# Patient Record
Sex: Male | Born: 2000 | Race: Black or African American | Hispanic: No | Marital: Single | Smoking: Never smoker
Health system: Southern US, Community
[De-identification: ages and names within clinical notes are randomized; demographics above are authoritative.]

## PROBLEM LIST (undated history)

## (undated) DIAGNOSIS — J45909 Unspecified asthma, uncomplicated: Secondary | ICD-10-CM

## (undated) HISTORY — DX: Unspecified asthma, uncomplicated: J45.909

---
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
## 2022-02-17 ENCOUNTER — Ambulatory Visit
Admission: EM | Admit: 2022-02-17 | Discharge: 2022-02-17 | Disposition: A | Discharge status: Home / Self Care | Payer: 59 | Attending: Nurse Practitioner | Admitting: Nurse Practitioner

## 2022-02-17 DIAGNOSIS — J069 Acute upper respiratory infection, unspecified: Secondary | ICD-10-CM | POA: Diagnosis not present

## 2022-02-17 DIAGNOSIS — U071 COVID-19: Secondary | ICD-10-CM | POA: Diagnosis not present

## 2022-02-17 LAB — RESP PANEL BY RT-PCR (FLU A&B, COVID) ARPGX2
Influenza A by PCR: NEGATIVE
Influenza B by PCR: NEGATIVE
SARS Coronavirus 2 by RT PCR: POSITIVE — AB

## 2022-02-17 MED ORDER — BENZONATATE 100 MG PO CAPS: 100.0000 mg | ORAL_CAPSULE | Freq: Three times a day (TID) | ORAL | 0 refills | Status: DC | PRN

## 2022-02-17 NOTE — Discharge Instructions (Addendum)
Your symptoms and exam findings are most consistent with a viral upper respiratory infection. These usually run their course in about 10 days.  If your symptoms last longer than 10 days without improvement, please follow up with your primary care provider.  If your symptoms, worsen, please go to the Emergency Room.    We have tested you today for COVID-19 and influenza.  You will see the results in Mychart and we will call you with positive results.    Please stay home and isolate until you are aware of the results.    Some things that can make you feel better are: - Increased rest - Increasing fluid with water/sugar free electrolytes - Acetaminophen and ibuprofen as needed for fever/pain.  - Salt water gargling, chloraseptic spray and throat lozenges - OTC guaifenesin (Mucinex).  - Saline sinus flushes or a neti pot.  - Humidifying the air. - Cough perles during the day a needed for dry cough.

## 2022-02-17 NOTE — ED Provider Notes (Signed)
[facility]    CSN: [id number] Arrival date & time: 02/17/22  1015      History   Chief Complaint Chief Complaint  Patient presents with   Cough    HPI Michael Francis is a 21 y.o. male.   Patient presents with symptoms that began Saturday evening.  He endorses body aches, chills, dry cough, some nasal congestion and postnasal drainage, runny nose, loss of appetite, and fatigue.  He denies fevers at home, shortness of breath, chest pain, chest congestion, sore throat, headache, tooth or ear pain/pressure, drainage, abdominal pain, nausea/vomiting, diarrhea, loss of taste and smell, and rash.  Reports mom, sister, and stepfather are also sick with similar symptoms.  He has taken Tylenol which does help with his symptoms.  Reports he stayed out of work because of his symptoms.  Medical history significant for asthma.  Reports he has not needed to use his albuterol inhaler.     Past Medical History:  Diagnosis Date   Asthma     There are no problems to display for this patient.   History reviewed. No pertinent surgical history.     Home Medications    Prior to Admission medications   Medication Sig Start Date End Date Taking? Authorizing Provider  benzonatate (TESSALON) 100 MG capsule Take 1 capsule (100 mg total) by mouth 3 (three) times daily as needed for cough. Do not take with alcohol or while driving or operating heavy machinery 02/17/22  Yes Cathlean Marseilles A, NP  albuterol (VENTOLIN HFA) 108 (90 Base) MCG/ACT inhaler 2 puffs every 4-6 hours as needed coughing or wheezing. 12/02/21   Lucio Edward, MD  cetirizine (ZYRTEC) 10 MG tablet 1 tab p.o. nightly as needed allergies. 10/30/21   Lucio Edward, MD    Family History Family History  Problem Relation Age of Onset   Diabetes Maternal Grandfather    Asthma Father    Arthritis Neg Hx    Cancer Neg Hx    Hypertension Neg Hx    Kidney disease Neg Hx    Stroke Neg Hx     Social  History Social History   Tobacco Use   Smoking status: Never   Smokeless tobacco: Never  Vaping Use   Vaping Use: Never used  Substance Use Topics   Alcohol use: No   Drug use: No     Allergies   Patient has no known allergies.   Review of Systems Review of Systems Per HPI  Physical Exam Triage Vital Signs ED Triage Vitals  Enc Vitals Group     BP 02/17/22 1037 131/85     Pulse Rate 02/17/22 1037 95     Resp 02/17/22 1037 18     Temp 02/17/22 1037 99 F (37.2 C)     Temp Source 02/17/22 1037 Oral     SpO2 02/17/22 1037 95 %     Weight 02/17/22 1037 200 lb (90.7 kg)     Height --      Head Circumference --      Peak Flow --      Pain Score 02/17/22 1040 0     Pain Loc --      Pain Edu? --      Excl. in GC? --    No data found.  Updated Vital Signs BP 131/85 (BP Location: Right Arm)   Pulse 95   Temp 99 F (37.2 C) (Oral)   Resp 18   Wt 200 lb (90.7 kg)  SpO2 95%   BMI 28.16 kg/m   Visual Acuity Right Eye Distance:   Left Eye Distance:   Bilateral Distance:    Right Eye Near:   Left Eye Near:    Bilateral Near:     Physical Exam Vitals and nursing note reviewed.  Constitutional:      General: He is not in acute distress.    Appearance: Normal appearance. He is not ill-appearing or toxic-appearing.  HENT:     Head: Normocephalic and atraumatic.     Right Ear: Tympanic membrane, ear canal and external ear normal.     Left Ear: Tympanic membrane, ear canal and external ear normal.     Nose: Congestion and rhinorrhea present.     Mouth/Throat:     Mouth: Mucous membranes are moist.     Pharynx: Oropharynx is clear. Posterior oropharyngeal erythema present. No oropharyngeal exudate.  Eyes:     General: No scleral icterus.    Extraocular Movements: Extraocular movements intact.  Cardiovascular:     Rate and Rhythm: Normal rate and regular rhythm.  Pulmonary:     Effort: Pulmonary effort is normal. No respiratory distress.     Breath sounds:  Normal breath sounds. No wheezing, rhonchi or rales.  Musculoskeletal:     Cervical back: Normal range of motion and neck supple.  Lymphadenopathy:     Cervical: No cervical adenopathy.  Skin:    General: Skin is warm and dry.     Coloration: Skin is not jaundiced or pale.     Findings: No erythema or rash.  Neurological:     Mental Status: He is alert and oriented to person, place, and time.  Psychiatric:        Behavior: Behavior is cooperative.      UC Treatments / Results  Labs (all labs ordered are listed, but only abnormal results are displayed) Labs Reviewed  RESP PANEL BY RT-PCR (FLU A&B, COVID) [id number]    EKG   Radiology No results found.  Procedures Procedures (including critical care time)  Medications Ordered in UC Medications - No data to display  Initial Impression / Assessment and Plan / UC Course  I have reviewed the triage vital signs and the nursing notes.  Pertinent labs & imaging results that were available during my care of the patient were reviewed by me and considered in my medical decision making (see chart for details).    Patient is a very pleasant, well-appearing 21 year old male presenting for fatigue, cough, congestion.  In triage, he is normotensive, has a low-grade fever, not tachycardic, not tachypneic, and oxygenating well on room air.  He appears well and stable.  COVID-19, influenza testing obtained.  Reassured patient that signs and symptoms are consistent with viral upper respiratory infection and discussed supportive care.  Can use Tessalon Perles every 3 hours as needed for cough.  Recommended increasing albuterol inhaler use as needed for wheezing/shortness of breath.  ER precautions discussed.  Seek care if symptoms persist more than 7 to 10 days without improvement despite supportive care.  Note given for work.  The patient was given the opportunity to ask questions.  All questions answered to their satisfaction.  The patient is in  agreement to this plan.   Final Clinical Impressions(s) / UC Diagnoses   Final diagnoses:  Viral URI with cough     Discharge Instructions      Your symptoms and exam findings are most consistent with a viral upper respiratory infection. These usually run  their course in about 10 days.  If your symptoms last longer than 10 days without improvement, please follow up with your primary care provider.  If your symptoms, worsen, please go to the Emergency Room.    We have tested you today for COVID-19 and influenza.  You will see the results in Mychart and we will call you with positive results.    Please stay home and isolate until you are aware of the results.    Some things that can make you feel better are: - Increased rest - Increasing fluid with water/sugar free electrolytes - Acetaminophen and ibuprofen as needed for fever/pain.  - Salt water gargling, chloraseptic spray and throat lozenges - OTC guaifenesin (Mucinex).  - Saline sinus flushes or a neti pot.  - Humidifying the air. - Cough perles during the day a needed for dry cough.      ED Prescriptions     Medication Sig Dispense Auth. Provider   benzonatate (TESSALON) 100 MG capsule Take 1 capsule (100 mg total) by mouth 3 (three) times daily as needed for cough. Do not take with alcohol or while driving or operating heavy machinery 21 capsule Valentino Nose, NP      PDMP not reviewed this encounter.   Valentino Nose, NP 02/17/22 [phone]

## 2022-02-23 DIAGNOSIS — R519 Headache, unspecified: Secondary | ICD-10-CM | POA: Insufficient documentation

## 2022-02-23 DIAGNOSIS — R42 Dizziness and giddiness: Secondary | ICD-10-CM | POA: Diagnosis not present

## 2022-02-24 ENCOUNTER — Emergency Department (HOSPITAL_COMMUNITY): Payer: Medicaid Other

## 2022-02-24 ENCOUNTER — Other Ambulatory Visit: Payer: Self-pay

## 2022-02-24 ENCOUNTER — Emergency Department (HOSPITAL_COMMUNITY)
Admission: EM | Admit: 2022-02-24 | Discharge: 2022-02-24 | Disposition: A | Discharge status: Home / Self Care | Payer: Medicaid Other | Attending: Emergency Medicine | Admitting: Emergency Medicine

## 2022-02-24 ENCOUNTER — Encounter (HOSPITAL_COMMUNITY): Payer: Self-pay

## 2022-02-24 DIAGNOSIS — R42 Dizziness and giddiness: Secondary | ICD-10-CM | POA: Diagnosis not present

## 2022-02-24 DIAGNOSIS — R519 Headache, unspecified: Secondary | ICD-10-CM | POA: Diagnosis not present

## 2022-02-24 NOTE — ED Triage Notes (Signed)
Pov from home. Cc of a headache. Took tylenol around 11pm that is slowly helping. COVID+ as of Tuesday.  1/10 pain.  Wants other symptoms looked at that have been gong on for years while he was here. Never told his pcp. Wasn't going to say anything but he thought about it while he was at the lobby.  States he has occ dizziness and balance problems that.

## 2022-02-24 NOTE — ED Provider Notes (Signed)
[facility] EMERGENCY DEPARTMENT Provider Note   CSN: [id number] Arrival date & time: 02/23/22  2328     History  Chief Complaint  Patient presents with   Headache    Michael Francis is a 21 y.o. male.  Presents to the emergency department with various complaints.  Patient came in tonight because of a headache.  He reports that he gets frequent headaches.  He took Tylenol at home and the headache did not improve so he presented to the ER.  He reports that while he was waiting in the waiting room, however, his headache has eased off.  Patient concerned because he has noticed frequent headaches, intermittent episodes of dizziness, sometimes causing him to have difficulty walking because he is off balance.  He also has a history of memory problems.  These problems have been going on for several years, has not been evaluated by any physician.       Home Medications Prior to Admission medications   Medication Sig Start Date End Date Taking? Authorizing Provider  albuterol (VENTOLIN HFA) 108 (90 Base) MCG/ACT inhaler 2 puffs every 4-6 hours as needed coughing or wheezing. 12/02/21   Lucio Edward, MD  benzonatate (TESSALON) 100 MG capsule Take 1 capsule (100 mg total) by mouth 3 (three) times daily as needed for cough. Do not take with alcohol or while driving or operating heavy machinery 02/17/22   Valentino Nose, NP  cetirizine (ZYRTEC) 10 MG tablet 1 tab p.o. nightly as needed allergies. 10/30/21   Lucio Edward, MD      Allergies    Patient has no known allergies.    Review of Systems   Review of Systems  Physical Exam Updated Vital Signs BP (!) 109/95   Pulse (!) 110   Temp 98.2 F (36.8 C) (Oral)   Resp 20   Ht 5\' 10"  (1.778 m)   Wt 90.7 kg   SpO2 100%   BMI 28.69 kg/m  Physical Exam Vitals and nursing note reviewed.  Constitutional:      General: He is not in acute distress.    Appearance: He is well-developed.  HENT:     Head: Normocephalic and  atraumatic.     Mouth/Throat:     Mouth: Mucous membranes are moist.  Eyes:     General: Vision grossly intact. Gaze aligned appropriately.     Extraocular Movements: Extraocular movements intact.     Conjunctiva/sclera: Conjunctivae normal.  Cardiovascular:     Rate and Rhythm: Normal rate and regular rhythm.     Pulses: Normal pulses.     Heart sounds: Normal heart sounds, S1 normal and S2 normal. No murmur heard.    No friction rub. No gallop.  Pulmonary:     Effort: Pulmonary effort is normal. No respiratory distress.     Breath sounds: Normal breath sounds.  Abdominal:     Palpations: Abdomen is soft.     Tenderness: There is no abdominal tenderness. There is no guarding or rebound.     Hernia: No hernia is present.  Musculoskeletal:        General: No swelling.     Cervical back: Full passive range of motion without pain, normal range of motion and neck supple. No pain with movement, spinous process tenderness or muscular tenderness. Normal range of motion.     Right lower leg: No edema.     Left lower leg: No edema.  Skin:    General: Skin is warm and dry.  Capillary Refill: Capillary refill takes less than 2 seconds.     Findings: No ecchymosis, erythema, lesion or wound.  Neurological:     Mental Status: He is alert and oriented to person, place, and time.     GCS: GCS eye subscore is 4. GCS verbal subscore is 5. GCS motor subscore is 6.     Cranial Nerves: Cranial nerves 2-12 are intact.     Sensory: Sensation is intact.     Motor: Motor function is intact. No weakness or abnormal muscle tone.     Coordination: Coordination is intact.     Comments: Extraocular muscle movement: normal No visual field cut Pupils: equal and reactive both direct and consensual response is normal No nystagmus present    Sensory function is intact to light touch, pinprick Proprioception intact  Grip strength 5/5 symmetric in upper extremities No pronator drift Normal finger to nose  bilaterally  Lower extremity strength 5/5 against gravity Normal heel to shin bilaterally  Gait: normal   Psychiatric:        Mood and Affect: Mood normal.        Speech: Speech normal.        Behavior: Behavior normal.     ED Results / Procedures / Treatments   Labs (all labs ordered are listed, but only abnormal results are displayed) Labs Reviewed - No data to display  EKG None  Radiology CT HEAD WO CONTRAST ( )  Result Date: 02/24/2022 CLINICAL DATA:  Dizziness, persistent/recurrent, cardiac or vascular cause suspected. Headache. EXAM: CT HEAD WITHOUT CONTRAST TECHNIQUE: Contiguous axial images were obtained from the base of the skull through the vertex without intravenous contrast. RADIATION DOSE REDUCTION: This exam was performed according to the departmental dose-optimization program which includes automated exposure control, adjustment of the mA and/or kV according to patient size and/or use of iterative reconstruction technique. COMPARISON:  None Available. FINDINGS: Brain: No acute intracranial hemorrhage, midline shift or mass effect. No extra-axial fluid collection. Gray-white matter differentiation is within normal limits. No hydrocephalus. Vascular: No hyperdense vessel or unexpected calcification. Skull: Normal. Negative for fracture or focal lesion. Sinuses/Orbits: Round density in the ethmoid air cells on the left, possible mucosal retention cyst. No acute orbital abnormality. Other: None. IMPRESSION: No acute intracranial process. Electronically Signed   By: Thornell Sartorius M.D.   On: 02/24/2022 01:28    Procedures Procedures    Medications Ordered in ED Medications - No data to display  ED Course/ Medical Decision Making/ A&P                           Medical Decision Making Amount and/or Complexity of Data Reviewed Radiology: ordered.   Presents for headache tonight.  His headache has resolved after he took Tylenol at home.  He has a normal neurologic  exam.  Patient seems very nervous and concerned about symptoms that have been ongoing for years.  A CT of his head was performed to further evaluate his constellation of neurologic symptoms.  CT is normal as is his exam.  I recommend he follow-up with his primary doctor perhaps for referral to neurology if symptoms continue.        Final Clinical Impression(s) / ED Diagnoses Final diagnoses:  Bad headache    Rx / DC Orders ED Discharge Orders     None         Dashana Guizar, Canary Brim, MD 02/24/22 [phone]

## 2022-03-01 ENCOUNTER — Emergency Department (HOSPITAL_COMMUNITY)
Admission: EM | Admit: 2022-03-01 | Discharge: 2022-03-01 | Disposition: A | Discharge status: Home / Self Care | Payer: 59 | Attending: Emergency Medicine | Admitting: Emergency Medicine

## 2022-03-01 ENCOUNTER — Encounter (HOSPITAL_COMMUNITY): Payer: Self-pay

## 2022-03-01 ENCOUNTER — Other Ambulatory Visit: Payer: Self-pay

## 2022-03-01 DIAGNOSIS — R112 Nausea with vomiting, unspecified: Secondary | ICD-10-CM | POA: Diagnosis not present

## 2022-03-01 DIAGNOSIS — R519 Headache, unspecified: Secondary | ICD-10-CM | POA: Diagnosis not present

## 2022-03-01 DIAGNOSIS — R110 Nausea: Secondary | ICD-10-CM

## 2022-03-01 MED ORDER — ACETAMINOPHEN 325 MG PO TABS: 650.0000 mg | ORAL_TABLET | Freq: Once | ORAL | Status: DC

## 2022-03-01 NOTE — Discharge Instructions (Addendum)
Please drink plenty of fluids and get plenty of rest.  Follow-up with your primary care doctor if needed.  Return to the emergency department for any worsening symptoms.

## 2022-03-01 NOTE — ED Triage Notes (Signed)
Pt c/o headache and nausea starting this morning.  Sts he thinks he ate some bad chips last night.  Pain score 4/10.    Pt reports "I'm really just here for a work note.  I called out and they said I need a note."

## 2022-03-01 NOTE — ED Provider Notes (Signed)
[facility] EMERGENCY DEPARTMENT Provider Note   CSN: [id number] Arrival date & time: 03/01/22  1311     History Chief Complaint  Patient presents with   Headache   Nausea    Michael Francis is a 21 y.o. male patient who presents to the emergency department today for further evaluation of a headache and nausea.  He states that he ate some bad food yesterday.  He denies any vomiting or abdominal pain.  He called out of work today.  Main request today is a work note for calling out of work today.  Denies any other symptoms.    Headache      Home Medications Prior to Admission medications   Medication Sig Start Date End Date Taking? Authorizing Provider  albuterol (VENTOLIN HFA) 108 (90 Base) MCG/ACT inhaler 2 puffs every 4-6 hours as needed coughing or wheezing. 12/02/21   Lucio Edward, MD  benzonatate (TESSALON) 100 MG capsule Take 1 capsule (100 mg total) by mouth 3 (three) times daily as needed for cough. Do not take with alcohol or while driving or operating heavy machinery 02/17/22   Valentino Nose, NP  cetirizine (ZYRTEC) 10 MG tablet 1 tab p.o. nightly as needed allergies. 10/30/21   Lucio Edward, MD      Allergies    Patient has no known allergies.    Review of Systems   Review of Systems  Neurological:  Positive for headaches.  All other systems reviewed and are negative.   Physical Exam Updated Vital Signs Ht 5\' 10"  (1.778 m)   Wt 90.3 kg   BMI 28.55 kg/m  Physical Exam Vitals and nursing note reviewed.  Constitutional:      Appearance: Normal appearance.  HENT:     Head: Normocephalic and atraumatic.  Eyes:     General:        Right eye: No discharge.        Left eye: No discharge.     Conjunctiva/sclera: Conjunctivae normal.  Pulmonary:     Effort: Pulmonary effort is normal.  Skin:    General: Skin is warm and dry.     Findings: No rash.  Neurological:     General: No focal deficit present.     Mental Status: He is alert.   Psychiatric:        Mood and Affect: Mood normal.        Behavior: Behavior normal.     ED Results / Procedures / Treatments   Labs (all labs ordered are listed, but only abnormal results are displayed) Labs Reviewed - No data to display  EKG None  Radiology No results found.  Procedures Procedures    Medications Ordered in ED Medications  acetaminophen (TYLENOL) tablet 650 mg (has no administration in time range)    ED Course/ Medical Decision Making/ A&P                           Medical Decision Making [name] is a 21 y.o. male patient who presents to the emergency department today for evaluation of nausea and to obtain a work note.  Patient is in no acute distress.  Patient does not want any further work-up and just requested a work note.  Work note will be provided and he is safe for discharge   Risk OTC drugs.   Final Clinical Impression(s) / ED Diagnoses Final diagnoses:  Nonintractable headache, unspecified chronicity pattern, unspecified headache type  Nausea  without vomiting    Rx / DC Orders ED Discharge Orders     None         Teressa Lower, [place] 03/01/22 1349    Kneller, Risco K, DO 03/01/22 1534

## 2022-04-15 ENCOUNTER — Emergency Department (HOSPITAL_COMMUNITY)
Admission: EM | Admit: 2022-04-15 | Discharge: 2022-04-15 | Disposition: A | Discharge status: Home / Self Care | Payer: Worker's Compensation | Attending: Emergency Medicine | Admitting: Emergency Medicine

## 2022-04-15 ENCOUNTER — Other Ambulatory Visit: Payer: Self-pay

## 2022-04-15 ENCOUNTER — Encounter (HOSPITAL_COMMUNITY): Payer: Self-pay

## 2022-04-15 DIAGNOSIS — S79921A Unspecified injury of right thigh, initial encounter: Secondary | ICD-10-CM | POA: Diagnosis present

## 2022-04-15 DIAGNOSIS — S71131A Puncture wound without foreign body, right thigh, initial encounter: Secondary | ICD-10-CM | POA: Insufficient documentation

## 2022-04-15 DIAGNOSIS — W460XXA Contact with hypodermic needle, initial encounter: Secondary | ICD-10-CM | POA: Insufficient documentation

## 2022-04-15 DIAGNOSIS — T148XXA Other injury of unspecified body region, initial encounter: Secondary | ICD-10-CM

## 2022-04-15 DIAGNOSIS — Y990 Civilian activity done for income or pay: Secondary | ICD-10-CM | POA: Diagnosis not present

## 2022-04-15 LAB — RAPID HIV SCREEN (HIV 1/2 AB+AG)
HIV 1/2 Antibodies: NONREACTIVE
HIV-1 P24 Antigen - HIV24: NONREACTIVE

## 2022-04-15 NOTE — ED Provider Notes (Signed)
[facility] EMERGENCY DEPARTMENT Provider Note   CSN: [id number] Arrival date & time: 04/15/22  1937     History {Add pertinent medical, surgical, social history, OB history to HPI:1} Chief Complaint  Patient presents with   Possible needle stick    Michael Francis is a 21 y.o. male presenting to ED with concern for possible needlestick.  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  He ignored it.  This occurred 3 days ago.  However he noted a small itchy spot on the outside of his right leg.  He is concerned about a needlestick.  As per his triage note, he reports separately concerns for potential "white spots" in his stool that he noted after a bowel movement recently, but states he does not want this worked up further at this time.  HPI     Home Medications Prior to Admission medications   Medication Sig Start Date End Date Taking? Authorizing Provider  albuterol (VENTOLIN HFA) 108 (90 Base) MCG/ACT inhaler 2 puffs every 4-6 hours as needed coughing or wheezing. 12/02/21   Lucio Edward, MD  benzonatate (TESSALON) 100 MG capsule Take 1 capsule (100 mg total) by mouth 3 (three) times daily as needed for cough. Do not take with alcohol or while driving or operating heavy machinery 02/17/22   Valentino Nose, NP  cetirizine (ZYRTEC) 10 MG tablet 1 tab p.o. nightly as needed allergies. 10/30/21   Lucio Edward, MD      Allergies    Patient has no known allergies.    Review of Systems   Review of Systems  Physical Exam Updated Vital Signs BP 131/86 (BP Location: Right Arm)   Pulse (!) 107   Temp 98.3 F (36.8 C) (Oral)   Resp 18   Ht 5\' 10"  (1.778 m)   Wt 93 kg   SpO2 100%   BMI 29.41 kg/m  Physical Exam Constitutional:      General: He is not in acute distress. HENT:     Head: Normocephalic and atraumatic.  Eyes:     Conjunctiva/sclera: Conjunctivae  normal.     Pupils: Pupils are equal, round, and reactive to light.  Cardiovascular:     Rate and Rhythm: Normal rate and regular rhythm.  Pulmonary:     Effort: Pulmonary effort is normal. No respiratory distress.  Skin:    General: Skin is warm and dry.     Comments: Small punctate lesion on the right anterior thigh without surrounding erythema or underlying induration  Neurological:     General: No focal deficit present.     Mental Status: He is alert. Mental status is at baseline.  Psychiatric:        Mood and Affect: Mood normal.        Behavior: Behavior normal.     ED Results / Procedures / Treatments   Labs (all labs ordered are listed, but only abnormal results are displayed) Labs Reviewed - No data to display  EKG None  Radiology No results found.  Procedures Procedures  {Document cardiac monitor, telemetry assessment procedure when appropriate:1}  Medications Ordered in ED Medications - No data to display  ED Course/ Medical Decision Making/ A&P                           Medical Decision Making Amount and/or Complexity of Data Reviewed  Labs: ordered.   Patient is here concerned for potential needlestick.  It is not clear what may have jabbed him at the time.  We will send HIV and hepatitis testing. I do not see evidence of skin infection or cellulitis.   {Document critical care time when appropriate:1} {Document review of labs and clinical decision tools ie heart score, Chads2Vasc2 etc:1}  {Document your independent review of radiology images, and any outside records:1} {Document your discussion with family members, caretakers, and with consultants:1} {Document social determinants of health affecting pt's care:1} {Document your decision making why or why not admission, treatments were needed:1} Final Clinical Impression(s) / ED Diagnoses Final diagnoses:  None    Rx / DC Orders ED Discharge Orders     None

## 2022-04-15 NOTE — ED Triage Notes (Addendum)
Pt is an employee for [employer] and was working Friday and when pt took out the trash something "pricked" on the side of the right leg. Pt states he does not know what it was and did not think too much of it. Pt now states that site is red and itchy. Pt states he feels fine but is now scared he was stuck by a dirty needle.  Pt also wants to be seen for white substance when he wiped after having a bowl movement.

## 2022-04-15 NOTE — ED Notes (Signed)
Went over dc papers. Verbalized understanding. Ambulatory to lobby.  

## 2022-04-15 NOTE — Discharge Instructions (Signed)
Please follow-up tomorrow morning on the results of your HIV and hepatitis test.

## 2022-04-16 LAB — HEPATITIS PANEL, ACUTE
HCV Ab: NONREACTIVE
Hep A IgM: NONREACTIVE
Hep B C IgM: NONREACTIVE
Hepatitis B Surface Ag: NONREACTIVE

## 2022-06-16 ENCOUNTER — Encounter (HOSPITAL_COMMUNITY): Payer: Self-pay

## 2022-06-16 ENCOUNTER — Emergency Department (HOSPITAL_COMMUNITY)
Admission: EM | Admit: 2022-06-16 | Discharge: 2022-06-16 | Disposition: A | Discharge status: Home / Self Care | Payer: Medicaid Other | Attending: Emergency Medicine | Admitting: Emergency Medicine

## 2022-06-16 ENCOUNTER — Other Ambulatory Visit: Payer: Self-pay

## 2022-06-16 ENCOUNTER — Emergency Department (HOSPITAL_COMMUNITY): Payer: Medicaid Other

## 2022-06-16 DIAGNOSIS — R918 Other nonspecific abnormal finding of lung field: Secondary | ICD-10-CM | POA: Diagnosis not present

## 2022-06-16 DIAGNOSIS — Z8616 Personal history of COVID-19: Secondary | ICD-10-CM | POA: Diagnosis not present

## 2022-06-16 DIAGNOSIS — R051 Acute cough: Secondary | ICD-10-CM

## 2022-06-16 DIAGNOSIS — U071 COVID-19: Secondary | ICD-10-CM | POA: Insufficient documentation

## 2022-06-16 DIAGNOSIS — J45909 Unspecified asthma, uncomplicated: Secondary | ICD-10-CM | POA: Insufficient documentation

## 2022-06-16 DIAGNOSIS — R059 Cough, unspecified: Secondary | ICD-10-CM | POA: Diagnosis not present

## 2022-06-16 LAB — RESP PANEL BY RT-PCR (RSV, FLU A&B, COVID)  RVPGX2
Influenza A by PCR: NEGATIVE
Influenza B by PCR: NEGATIVE
Resp Syncytial Virus by PCR: NEGATIVE
SARS Coronavirus 2 by RT PCR: POSITIVE — AB

## 2022-06-16 MED ORDER — AMOXICILLIN 500 MG PO CAPS: 500.0000 mg | ORAL_CAPSULE | Freq: Three times a day (TID) | ORAL | 0 refills | Status: DC

## 2022-06-16 MED ORDER — NAPROXEN 250 MG PO TABS
500.0000 mg | ORAL_TABLET | Freq: Once | ORAL | Status: AC
  Administered 2022-06-16: 500 mg via ORAL
  Filled 2022-06-16: qty 2

## 2022-06-16 MED ORDER — ALBUTEROL SULFATE HFA 108 (90 BASE) MCG/ACT IN AERS: 2.0000 | INHALATION_SPRAY | RESPIRATORY_TRACT | 1 refills | Status: AC | PRN

## 2022-06-16 MED ORDER — NAPROXEN 500 MG PO TABS: 500.0000 mg | ORAL_TABLET | Freq: Two times a day (BID) | ORAL | 0 refills | Status: DC

## 2022-06-16 MED ORDER — AZITHROMYCIN 250 MG PO TABS: ORAL_TABLET | ORAL | 0 refills | Status: DC

## 2022-06-16 MED ORDER — ALBUTEROL SULFATE HFA 108 (90 BASE) MCG/ACT IN AERS
2.0000 | INHALATION_SPRAY | Freq: Once | RESPIRATORY_TRACT | Status: AC
  Administered 2022-06-16: 2 via RESPIRATORY_TRACT
  Filled 2022-06-16: qty 6.7

## 2022-06-16 NOTE — ED Triage Notes (Signed)
Pt reports cough and congestion x 2 days.

## 2022-06-16 NOTE — ED Provider Notes (Signed)
Patient initially seen by Dr. Pilar Plate.  Please see his note.  Plan was to follow-up on the chest x-ray.  Follow-up chest x-ray result however does not show any signs of pneumonia.  Will discontinue the antibiotic regimen.  Patient is positive for COVID which would account for his symptoms.  No indication for antibiotics  DG Chest 2 View  Result Date: 06/16/2022 CLINICAL DATA:  Cough and congestion since Thursday EXAM: CHEST - 2 VIEW COMPARISON:  Earlier today FINDINGS: Normal heart size and mediastinal contours. There is no edema, consolidation, effusion, or pneumothorax. The sharp interface along the right paramediastinal contour is likely confluence of the manubrial cortex and anterior junction line, appearance similar to a 2017 radiograph. IMPRESSION: No active cardiopulmonary disease. No pulmonary collapse or other abnormality on the lateral view. Electronically Signed   By: Tiburcio Pea M.D.   On: 06/16/2022 07:03   DG Chest Port 1 View  Result Date: 06/16/2022 CLINICAL DATA:  Coughing congestion. EXAM: PORTABLE CHEST 1 VIEW COMPARISON:  07/18/2015 FINDINGS: The lungs are clear without focal pneumonia, edema, pneumothorax or pleural effusion. Abnormal right paramediastinal contour raises concern for a component of right upper lobe collapse. The cardiopericardial silhouette is within normal limits for size. The visualized bony structures of the thorax are unremarkable. IMPRESSION: Abnormal right paramediastinal opacity concerning for underlying element of right upper lobe collapse. Dedicated PA and lateral upright chest x-ray could be used to further evaluate. CT chest could also be employed. Electronically Signed   By: Kennith Center M.D.   On: 06/16/2022 06:09      Linwood Dibbles, MD 06/16/22 [phone]

## 2022-06-16 NOTE — ED Provider Notes (Signed)
AP-EMERGENCY DEPT [facility] Emergency Department Provider Note MRN:  [id number]  Arrival date & time: 06/16/22     Chief Complaint   Cough (congestion)   History of Present Illness   Michael Francis is a 21 y.o. year-old male with a history of asthma presenting to the ED with chief complaint of cough.  Cough and congestion since Thursday, subjective fever, malaise, fatigue.  Feels like he has chest congestion and cannot cough it out.  Also has tooth pain that he thinks is due to a wisdom tooth.  Review of Systems  A thorough review of systems was obtained and all systems are negative except as noted in the HPI and PMH.   Patient's Health History    Past Medical History:  Diagnosis Date   Asthma     History reviewed. No pertinent surgical history.  Family History  Problem Relation Age of Onset   Diabetes Maternal Grandfather    Asthma Father    Arthritis Neg Hx    Cancer Neg Hx    Hypertension Neg Hx    Kidney disease Neg Hx    Stroke Neg Hx     Social History   Socioeconomic History   Marital status: Single    Spouse name: Not on file   Number of children: Not on file   Years of education: Not on file   Highest education level: Not on file  Occupational History   Not on file  Tobacco Use   Smoking status: Never   Smokeless tobacco: Never  Vaping Use   Vaping Use: Never used  Substance and Sexual Activity   Alcohol use: No   Drug use: No   Sexual activity: Never  Other Topics Concern   Not on file  Social History Narrative   Lives with mother, stepfather and 2 half siblings.   Graduated from high school.   Works at [employer].   Social Determinants of Health   Financial Resource Strain: Not on file  Food Insecurity: Not on file  Transportation Needs: Not on file  Physical Activity: Not on file  Stress: Not on file  Social Connections: Not on file  Intimate Partner Violence: Not on file     Physical Exam   Vitals:   06/16/22  0535  Temp: 99.9 F (37.7 C)    CONSTITUTIONAL: Well-appearing, NAD NEURO/PSYCH:  Alert and oriented x 3, no focal deficits EYES:  eyes equal and reactive ENT/NECK:  no LAD, no JVD CARDIO: Regular rate, well-perfused, normal S1 and S2 PULM: Rhonchi to the right upper lung GI/GU:  non-distended, non-tender MSK/SPINE:  No gross deformities, no edema SKIN:  no rash, atraumatic   *Additional and/or pertinent findings included in MDM below  Diagnostic and Interventional Summary    EKG Interpretation  Date/Time:    Ventricular Rate:    PR Interval:    QRS Duration:   QT Interval:    QTC Calculation:   R Axis:     Text Interpretation:         Labs Reviewed  RESP PANEL BY RT-PCR (RSV, FLU A&B, COVID)  [id number]    DG Chest Port 1 View  Final Result    DG Chest 2 View    (Results Pending)    Medications  naproxen (NAPROSYN) tablet 500 mg (500 mg Oral Given 06/16/22 0619)  albuterol (VENTOLIN HFA) 108 (90 Base) MCG/ACT inhaler 2 puff (2 puffs Inhalation Given 06/16/22 1761)     Procedures  /  Critical Care Procedures  ED Course and Medical Decision Making  Initial Impression and Ddx Favoring viral illness, bit of an asymmetric chest auscultation and so obtaining chest x-ray to evaluate for pneumonia.  Patient does have an impacted wisdom tooth, no signs of infection orally.  Past medical/surgical history that increases complexity of ED encounter: Asthma  Interpretation of Diagnostics I personally reviewed the Chest Xray and my interpretation is as follows: Question right upper lobe collapse, question pneumonia    Patient Reassessment and Ultimate Disposition/Management     Repeating x-rays to evaluate the question right upper lobe collapse, anticipating discharge with CAP coverage.  Patient management required discussion with the following services or consulting groups:  None  Complexity of Problems Addressed Acute complicated illness or Injury  Additional  Data Reviewed and Analyzed Further history obtained from: None  Additional Factors Impacting ED Encounter Risk Prescriptions  Elmer Sow. Pilar Plate, MD [facility] Health Emergency Medicine [facility] [email or web address]  Final Clinical Impressions(s) / ED Diagnoses     ICD-10-CM   1. Acute cough  R05.1       ED Discharge Orders          Ordered    azithromycin (ZITHROMAX) 250 MG tablet        06/16/22 0659    amoxicillin (AMOXIL) 500 MG capsule  3 times daily        06/16/22 0659    naproxen (NAPROSYN) 500 MG tablet  2 times daily        06/16/22 0659    albuterol (VENTOLIN HFA) 108 (90 Base) MCG/ACT inhaler  Every 4 hours PRN        06/16/22 0659             Discharge Instructions Discussed with and Provided to Patient:    Discharge Instructions      You were evaluated in the Emergency Department and after careful evaluation, we did not find any emergent condition requiring admission or further testing in the hospital.  Your exam/testing today is overall reassuring.  Symptoms may be due to pneumonia.  Take the amoxicillin and azithromycin antibiotics as directed.  Use Naprosyn as needed for discomfort.  Recommend follow-up with your primary care doctor and a dentist.  Please return to the Emergency Department if you experience any worsening of your condition.   Thank you for allowing [place] to be a part of your care.      Sabas Sous, MD 06/16/22 [phone]

## 2022-06-16 NOTE — Discharge Instructions (Addendum)
You were evaluated in the Emergency Department and after careful evaluation, we did not find any emergent condition requiring admission or further testing in the hospital.  Your exam/testing today is overall reassuring.  The CXR did not show pneumonia.  Your coivd test is positive which would explain your symptoms.  This should resolve on its own.  Use Naprosyn as needed for discomfort.  You can take over the counter cough medications as needed. Recommend follow-up with your primary care doctor and a dentist.  Please return to the Emergency Department if you experience any worsening of your condition.   Thank you for allowing [place] to be a part of your care.

## 2022-06-18 ENCOUNTER — Telehealth: Payer: Self-pay | Admitting: *Deleted

## 2022-06-18 DIAGNOSIS — U071 COVID-19: Secondary | ICD-10-CM | POA: Diagnosis not present

## 2022-06-18 DIAGNOSIS — J45909 Unspecified asthma, uncomplicated: Secondary | ICD-10-CM | POA: Diagnosis not present

## 2022-06-18 DIAGNOSIS — J301 Allergic rhinitis due to pollen: Secondary | ICD-10-CM | POA: Diagnosis not present

## 2022-06-18 NOTE — Patient Outreach (Signed)
  Care Coordination [facility] Note Transition Care Management Follow-up Telephone Call Date of discharge and from where: 06/16/22 from [facility] ED How have you been since you were released from the hospital? "I am doing ok." Any questions or concerns? Yes   Items Reviewed: Did the pt receive and understand the discharge instructions provided? Yes  Medications obtained and verified? Yes  Other? Yes  Any new allergies since your discharge? No  Dietary orders reviewed? No Do you have support at home? Yes   Home Care and Equipment/Supplies: Were home health services ordered? no If so, what is the name of the agency? N/A  Has the agency set up a time to come to the patient's home? not applicable Were any new equipment or medical supplies ordered?  No What is the name of the medical supply agency? N/A Were you able to get the supplies/equipment? not applicable Do you have any questions related to the use of the equipment or supplies? No  Functional Questionnaire: (I = Independent and D = Dependent) ADLs: I  Bathing/Dressing- I  Meal Prep- I  Eating- I  Maintaining continence- I  Transferring/Ambulation- I  Managing Meds- I  Follow up appointments reviewed:  PCP Hospital f/u appt confirmed? Yes  Patient had visit with PCP on 06/18/22. Specialist Hospital f/u appt confirmed?  N/A-ED visit Are transportation arrangements needed? No  If their condition worsens, is the pt aware to call PCP or go to the Emergency Dept.? Yes Was the patient provided with contact information for the PCP's office or ED? Yes Was to pt encouraged to call back with questions or concerns? Yes  SDOH assessments and interventions completed:   Yes

## 2022-07-15 ENCOUNTER — Ambulatory Visit (HOSPITAL_COMMUNITY)
Admission: RE | Admit: 2022-07-15 | Discharge: 2022-07-15 | Disposition: A | Discharge status: Home / Self Care | Payer: Commercial Managed Care - PPO | Source: Ambulatory Visit | Attending: Family Medicine | Admitting: Family Medicine

## 2022-07-15 ENCOUNTER — Other Ambulatory Visit (HOSPITAL_COMMUNITY): Payer: Self-pay | Admitting: Family Medicine

## 2022-07-15 DIAGNOSIS — J45909 Unspecified asthma, uncomplicated: Secondary | ICD-10-CM | POA: Diagnosis not present

## 2022-07-15 DIAGNOSIS — R079 Chest pain, unspecified: Secondary | ICD-10-CM | POA: Insufficient documentation

## 2022-07-15 DIAGNOSIS — R0789 Other chest pain: Secondary | ICD-10-CM | POA: Diagnosis not present

## 2022-08-08 DIAGNOSIS — J301 Allergic rhinitis due to pollen: Secondary | ICD-10-CM | POA: Diagnosis not present

## 2022-08-08 DIAGNOSIS — R030 Elevated blood-pressure reading, without diagnosis of hypertension: Secondary | ICD-10-CM | POA: Diagnosis not present

## 2022-08-08 DIAGNOSIS — K921 Melena: Secondary | ICD-10-CM | POA: Diagnosis not present

## 2022-08-08 DIAGNOSIS — J45909 Unspecified asthma, uncomplicated: Secondary | ICD-10-CM | POA: Diagnosis not present

## 2022-08-13 DIAGNOSIS — R142 Eructation: Secondary | ICD-10-CM | POA: Diagnosis not present

## 2022-08-13 DIAGNOSIS — K921 Melena: Secondary | ICD-10-CM | POA: Diagnosis not present

## 2022-08-13 DIAGNOSIS — R143 Flatulence: Secondary | ICD-10-CM | POA: Diagnosis not present

## 2022-08-15 DIAGNOSIS — K921 Melena: Secondary | ICD-10-CM | POA: Diagnosis not present

## 2022-08-29 DIAGNOSIS — H9221 Otorrhagia, right ear: Secondary | ICD-10-CM | POA: Diagnosis not present

## 2022-11-11 DIAGNOSIS — L989 Disorder of the skin and subcutaneous tissue, unspecified: Secondary | ICD-10-CM | POA: Diagnosis not present

## 2022-11-11 DIAGNOSIS — R2231 Localized swelling, mass and lump, right upper limb: Secondary | ICD-10-CM | POA: Diagnosis not present

## 2022-11-11 DIAGNOSIS — R519 Headache, unspecified: Secondary | ICD-10-CM | POA: Diagnosis not present

## 2022-11-11 DIAGNOSIS — R454 Irritability and anger: Secondary | ICD-10-CM | POA: Diagnosis not present

## 2023-01-26 ENCOUNTER — Other Ambulatory Visit: Payer: Self-pay

## 2023-01-26 ENCOUNTER — Encounter (HOSPITAL_COMMUNITY): Payer: Self-pay | Admitting: Emergency Medicine

## 2023-01-26 ENCOUNTER — Emergency Department (HOSPITAL_COMMUNITY)
Admission: EM | Admit: 2023-01-26 | Discharge: 2023-01-27 | Disposition: A | Discharge status: Other Acute Inpatient Hospital | Payer: Commercial Managed Care - PPO | Attending: Emergency Medicine | Admitting: Emergency Medicine

## 2023-01-26 DIAGNOSIS — Z008 Encounter for other general examination: Secondary | ICD-10-CM | POA: Insufficient documentation

## 2023-01-26 DIAGNOSIS — F329 Major depressive disorder, single episode, unspecified: Secondary | ICD-10-CM | POA: Insufficient documentation

## 2023-01-26 DIAGNOSIS — Z1152 Encounter for screening for COVID-19: Secondary | ICD-10-CM | POA: Diagnosis not present

## 2023-01-26 DIAGNOSIS — R000 Tachycardia, unspecified: Secondary | ICD-10-CM | POA: Diagnosis not present

## 2023-01-26 DIAGNOSIS — J45909 Unspecified asthma, uncomplicated: Secondary | ICD-10-CM | POA: Diagnosis not present

## 2023-01-26 LAB — CBC WITH DIFFERENTIAL/PLATELET
Abs Immature Granulocytes: 0.02 10*3/uL (ref 0.00–0.07)
Basophils Absolute: 0 10*3/uL (ref 0.0–0.1)
Basophils Relative: 1 %
Eosinophils Absolute: 0.1 10*3/uL (ref 0.0–0.5)
Eosinophils Relative: 1 %
HCT: 45.5 % (ref 39.0–52.0)
Hemoglobin: 14.9 g/dL (ref 13.0–17.0)
Immature Granulocytes: 0 %
Lymphocytes Relative: 27 %
Lymphs Abs: 2.4 10*3/uL (ref 0.7–4.0)
MCH: 26.2 pg (ref 26.0–34.0)
MCHC: 32.7 g/dL (ref 30.0–36.0)
MCV: 80 fL (ref 80.0–100.0)
Monocytes Absolute: 0.8 10*3/uL (ref 0.1–1.0)
Monocytes Relative: 9 %
Neutro Abs: 5.5 10*3/uL (ref 1.7–7.7)
Neutrophils Relative %: 62 %
Platelets: 319 10*3/uL (ref 150–400)
RBC: 5.69 MIL/uL (ref 4.22–5.81)
RDW: 15 % (ref 11.5–15.5)
WBC: 8.9 10*3/uL (ref 4.0–10.5)
nRBC: 0 % (ref 0.0–0.2)

## 2023-01-26 LAB — COMPREHENSIVE METABOLIC PANEL
ALT: 51 U/L — ABNORMAL HIGH (ref 0–44)
AST: 27 U/L (ref 15–41)
Albumin: 4.4 g/dL (ref 3.5–5.0)
Alkaline Phosphatase: 87 U/L (ref 38–126)
Anion gap: 11 (ref 5–15)
BUN: 11 mg/dL (ref 6–20)
CO2: 25 mmol/L (ref 22–32)
Calcium: 9.3 mg/dL (ref 8.9–10.3)
Chloride: 104 mmol/L (ref 98–111)
Creatinine, Ser: 0.86 mg/dL (ref 0.61–1.24)
GFR, Estimated: >60 mL/min (ref >60)
Glucose, Bld: 89 mg/dL (ref 70–99)
Potassium: 3.9 mmol/L (ref 3.5–5.1)
Sodium: 140 mmol/L (ref 135–145)
Total Bilirubin: 0.3 mg/dL (ref 0.3–1.2)
Total Protein: 7.8 g/dL (ref 6.5–8.1)

## 2023-01-26 LAB — ETHANOL: Alcohol, Ethyl (B): <10 mg/dL (ref <10)

## 2023-01-26 NOTE — ED Triage Notes (Signed)
Pt BIB [employer] under IVC, per paperwork pt is paranoid and has been violent/aggressive, pt is calm and cooperative in triage and PD reports he has been same with them, pt states he and his Mother were arguing earlier, his Mother left, after she came back PD showed up and arrested him and brought him here, pt reports he has thrown things when he's been angry, reports he does keep blinds closed but neighbors are strange and nosey, denies SI/HI

## 2023-01-26 NOTE — ED Provider Notes (Signed)
AP-EMERGENCY DEPT [facility] Emergency Department Provider Note MRN:  [id number]  Arrival date & time: 01/27/23     Chief Complaint   IVC   History of Present Illness   Adeeb Beto Fougerousse is a 22 y.o. year-old male with a history of asthma presenting to the ED with chief complaint of IVC.  Reportedly patient is acting paranoid at home, IVC by family.  Was in an argument with family and broke some property, concern for violence.  Review of Systems  A thorough review of systems was obtained and all systems are negative except as noted in the HPI and PMH.   Patient's Health History    Past Medical History:  Diagnosis Date   Asthma     History reviewed. No pertinent surgical history.  Family History  Problem Relation Age of Onset   Diabetes Maternal Grandfather    Asthma Father    Arthritis Neg Hx    Cancer Neg Hx    Hypertension Neg Hx    Kidney disease Neg Hx    Stroke Neg Hx     Social History   Socioeconomic History   Marital status: Single    Spouse name: Not on file   Number of children: Not on file   Years of education: Not on file   Highest education level: Not on file  Occupational History   Not on file  Tobacco Use   Smoking status: Never   Smokeless tobacco: Never  Vaping Use   Vaping status: Never Used  Substance and Sexual Activity   Alcohol use: No   Drug use: No   Sexual activity: Never  Other Topics Concern   Not on file  Social History Narrative   Lives with mother, stepfather and 2 half siblings.   Graduated from high school.   Works at [employer].   Social Determinants of Health   Financial Resource Strain: Not on file  Food Insecurity: Not on file  Transportation Needs: Not on file  Physical Activity: Not on file  Stress: Not on file  Social Connections: Not on file  Intimate Partner Violence: Not on file     Physical Exam   Vitals:   01/26/23 2149 01/26/23 2152  BP: (!) 143/115 (!) 142/89  Pulse: 92 82  Resp:  16 18  Temp: 98.7 F (37.1 C) 98.7 F (37.1 C)  SpO2: 100% 99%    CONSTITUTIONAL: Well-appearing, NAD NEURO/PSYCH:  Alert and oriented x 3, no focal deficits EYES:  eyes equal and reactive ENT/NECK:  no LAD, no JVD CARDIO: Regular rate, well-perfused, normal S1 and S2 PULM:  CTAB no wheezing or rhonchi GI/GU:  non-distended, non-tender MSK/SPINE:  No gross deformities, no edema SKIN:  no rash, atraumatic   *Additional and/or pertinent findings included in MDM below  Diagnostic and Interventional Summary    EKG Interpretation Date/Time:  Monday January 26 2023 23:40:53 EDT Ventricular Rate:  111 PR Interval:  142 QRS Duration:  84 QT Interval:  328 QTC Calculation: 446 R Axis:   96  Text Interpretation: Sinus tachycardia Rightward axis Borderline ECG No previous ECGs available Confirmed by Kennis Carina [phone]) on 01/27/2023 12:15:12 AM       Labs Reviewed  COMPREHENSIVE METABOLIC PANEL - Abnormal; Notable for the following components:      Result Value   ALT 51 (*)    All other components within normal limits  ETHANOL  CBC WITH DIFFERENTIAL/PLATELET  RAPID URINE DRUG SCREEN, HOSP PERFORMED    No orders  to display    Medications - No data to display   Procedures  /  Critical Care Procedures  ED Course and Medical Decision Making  Initial Impression and Ddx IVC for possible new delusion/paranoia and violent behavior.  Patient very calm and cooperative here in the emergency department.  Past medical/surgical history that increases complexity of ED encounter: None  Interpretation of Diagnostics I personally reviewed the EKG and my interpretation is as follows: Sinus rhythm  Labs reassuring with no significant blood count or electrolyte disturbance  Patient Reassessment and Ultimate Disposition/Management     Patient is medically cleared awaiting TTS evaluation.  Signed out to default provider.  Patient management required discussion with the following  services or consulting groups:  None  Complexity of Problems Addressed Acute illness or injury that poses threat of life of bodily function  Additional Data Reviewed and Analyzed Further history obtained from: Police on arrival  Additional Factors Impacting ED Encounter Risk Consideration of hospitalization  Elmer Sow. Pilar Plate, MD [facility] Emergency Medicine [facility] [email or web address]  Final Clinical Impressions(s) / ED Diagnoses     ICD-10-CM   1. Encounter for psychological evaluation  Z00.8       ED Discharge Orders     None        Discharge Instructions Discussed with and Provided to Patient:   Discharge Instructions   None      Sabas Sous, MD 01/27/23 [phone]

## 2023-01-27 DIAGNOSIS — J45909 Unspecified asthma, uncomplicated: Secondary | ICD-10-CM | POA: Diagnosis not present

## 2023-01-27 DIAGNOSIS — R040 Epistaxis: Secondary | ICD-10-CM | POA: Diagnosis not present

## 2023-01-27 DIAGNOSIS — F258 Other schizoaffective disorders: Secondary | ICD-10-CM | POA: Diagnosis not present

## 2023-01-27 DIAGNOSIS — F329 Major depressive disorder, single episode, unspecified: Secondary | ICD-10-CM | POA: Diagnosis not present

## 2023-01-27 DIAGNOSIS — R519 Headache, unspecified: Secondary | ICD-10-CM | POA: Diagnosis not present

## 2023-01-27 DIAGNOSIS — F419 Anxiety disorder, unspecified: Secondary | ICD-10-CM | POA: Diagnosis not present

## 2023-01-27 LAB — SARS CORONAVIRUS 2 BY RT PCR: SARS Coronavirus 2 by RT PCR: NEGATIVE

## 2023-01-27 MED ORDER — ACETAMINOPHEN 500 MG PO TABS
1000.0000 mg | ORAL_TABLET | Freq: Once | ORAL | Status: AC
  Administered 2023-01-27: 1000 mg via ORAL
  Filled 2023-01-27: qty 2

## 2023-01-27 NOTE — BH Assessment (Signed)
Comprehensive Clinical Assessment (CCA) Note  01/27/2023 Michael Francis [id number]  Disposition: Rockney Ghee, NP, patient meets inpatient criteria. Jeanella Anton, RN informed of disposition. Disposition SW will secure placement.   The patient demonstrates the following risk factors for suicide: Chronic risk factors for suicide include: psychiatric disorder of unknown per patient . Acute risk factors for suicide include: family or marital conflict and loss (financial, interpersonal, professional). Protective factors for this patient include: responsibility to others (children, family), coping skills, and hope for the future. Considering these factors, the overall suicide risk at this point appears to be moderate. Patient is not appropriate for outpatient follow up.  Michael Francis is a 22 year old male presenting [facility] under [facility]C due to violence, aggressiveness and paranoid behaviors. Patient denied SI, HI, psychosis and alcohol/drug usage. Patient reported history of diagnosis, however unable to recall the name of the diagnosis.   Patient reported getting into an argument with parents and that he lost control where he felt "I was out of reality". Patient reports "I got upset and poured drink everywhere, throwing things, the door". Patient reports when he gets angry he throws things. Patient reported arguing about him getting another car after his stepfather said he would buy one for him and then saying that he needed his own place and patient stated he was not financially able to do both, patient then became upset and became aggressive with his parents and destroying property. Patient reported not knowing why he acted in an aggressive way. Patient reported that his mother attempted several times to have him evaluated as a child, however he doesn't recall diagnosis or outcome. Patient reported stressors including needing a car, getting his own place and a broken laptop. Patient denied being  depressed. Patient reported normal sleep and appetite.   When asked about hallucinations, patient stated "I have thoughts of hating certain groups of people, not to hurt them but to hate them".   Patient denied receiving any outpatient mental health services. Patient denied being prescribed any psych medications. Patient denied prior psych inpatient treatment. Patient denied past suicide attempts and self-harming behaviors.   Patient currently resides with mother and stepdad. Patient reports family discord. 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 Patient denied access to guns. Patient was anxious and cooperative during assessment.   Collateral contact: Lavena Bullion, mother, [phone], consent received from patient to speak with mother for additional information. Attempted to contact mother, no answer.   Chief Complaint:  Chief Complaint  Patient presents with   IVC   Visit Diagnosis: Major depressive disorder   CCA Screening, Triage and Referral (STR)  Patient Reported Information How did you hear about [place]? Family/Friend  What Is the Reason for Your Visit/Call Today? IVC due to aggressive behaviors.  How Long Has This Been Causing You Problems? <Week  What Do You Feel Would Help You the Most Today? Treatment for Depression or other mood problem   Have You Recently Had Any Thoughts About Hurting Yourself? No  Are You Planning to Commit Suicide/Harm Yourself At This time? No   Flowsheet Row ED from 01/26/2023 in [facility] Department at [facility] ED from [facility]/16/2022 in [facility] Department at [facility] ED from [facility]/15/2022 in [facility] Emergency Department at [facility]  C-SSRS RISK CATEGORY No Risk No Risk No Risk       Have you Recently Had Thoughts About Hurting Someone Karolee Ohs? No  Are You Planning to  Harm Someone at This Time? No  Explanation: n/a   Have You Used Any Alcohol or Drugs in the Past 24  Hours? No  What Did You Use and How Much? n/a   Do You Currently Have a Therapist/Psychiatrist? No  Name of Therapist/Psychiatrist: Name of Therapist/Psychiatrist: n/a   Have You Been Recently Discharged From Any Office Practice or Programs? No  Explanation of Discharge From Practice/Program: n/a     CCA Screening Triage Referral Assessment Type of Contact: Tele-Assessment  Telemedicine Service Delivery: Telemedicine service delivery: This service was provided via telemedicine using a 2-way, interactive audio and video technology  Is this Initial or Reassessment? Is this Initial or Reassessment?: Initial Assessment  Date Telepsych consult ordered in [facility]:  Date Telepsych consult ordered in [facility]: 01/26/23  Time Telepsych consult ordered in [facility]:  Time Telepsych consult ordered in [facility]: 2343  Location of Assessment: [facility]  Provider Location: [facility] Assessment Services   Collateral Involvement: IVC   Does Patient Have a Automotive engineer Guardian? No  Legal Guardian Contact Information: n/a  Copy of Legal Guardianship Form: -- (n/a)  Legal Guardian Notified of Arrival: -- (n/a)  Legal Guardian Notified of Pending Discharge: -- (n/a)  If Minor and Not Living with Parent(s), Who has Custody? n/a  Is CPS involved or ever been involved? Never  Is APS involved or ever been involved? Never   Patient Determined To Be At Risk for Harm To Self or Others Based on Review of Patient Reported Information or Presenting Complaint? No  Method: No Plan  Availability of Means: No access or NA  Intent: Vague intent or NA  Notification Required: No need or identified person  Additional Information for Danger to Others Potential: -- (n/a)  Additional Comments for Danger to Others Potential: n/a  Are There Guns or Other Weapons in Your Home? No  Types of Guns/Weapons: n/a  Are These Weapons Safely Secured?                            -- (n/a)  Who Could Verify You Are  Able To Have These Secured: n/a  Do You Have any Outstanding Charges, Pending Court Dates, Parole/Probation? none reported  Contacted To Inform of Risk of Harm To Self or Others: Patent examiner; Family/Significant Other:    Does Patient Present under Involuntary Commitment? Yes    [place] of Residence: [place]   Patient Currently Receiving the Following Services: Not Receiving Services   Determination of Need: Urgent (48 hours)   Options For Referral: Medication Management; Outpatient Therapy; Inpatient Hospitalization     CCA Biopsychosocial Patient Reported Schizophrenia/Schizoaffective Diagnosis in Past: No   Strengths: self-awareness   Mental Health Symptoms Depression:   None   Duration of Depressive symptoms:  Duration of Depressive Symptoms: N/A   Mania:   Change in energy/activity; Irritability; Racing thoughts; Increased Energy   Anxiety:    Irritability; Restlessness; Difficulty concentrating   Psychosis:   Delusions   Duration of Psychotic symptoms:  Duration of Psychotic Symptoms: Less than six months   Trauma:   None   Obsessions:   None   Compulsions:   None   Inattention:   None   Hyperactivity/Impulsivity:   None   Oppositional/Defiant Behaviors:   None   Emotional Irregularity:   None   Other Mood/Personality Symptoms:   n/a    Mental Status Exam Appearance and self-care  Stature:   Average   Weight:  Average weight   Clothing:   Age-appropriate   Grooming:   Normal   Cosmetic use:   None   Posture/gait:   Normal   Motor activity:   Not Remarkable   Sensorium  Attention:   Normal   Concentration:   Normal   Orientation:   Time; Situation; Place; Person; Object   Recall/memory:   Normal   Affect and Mood  Affect:   Appropriate   Mood:   Anxious   Relating  Eye contact:   Normal   Facial expression:   Anxious   Attitude toward examiner:   Cooperative   Thought and  Language  Speech flow:  Clear and Coherent   Thought content:   Appropriate to Mood and Circumstances   Preoccupation:   None   Hallucinations:   None   Organization:   Coherent   [employer] of Knowledge:   Average   Intelligence:   Average   Abstraction:   Normal   Judgement:   Normal   Reality Testing:   Adequate   Insight:   Fair   Decision Making:   Impulsive   Social Functioning  Social Maturity:   Impulsive   Social Judgement:   Normal   Stress  Stressors:   Relationship   Coping Ability:   Overwhelmed; Exhausted   Skill Deficits:   Museum/gallery curator; Decision making   Supports:   Family     Religion: Religion/Spirituality Are You A Religious Person?: Yes How Might This Affect Treatment?: n/a  Leisure/Recreation: Leisure / Recreation Do You Have Hobbies?: Yes Leisure and Hobbies: video games  Exercise/Diet: Exercise/Diet Do You Exercise?: No Have You Gained or Lost A Significant Amount of Weight in the Past Six Months?: No Do You Follow a Special Diet?: No Do You Have Any Trouble Sleeping?: No   CCA Employment/Education Employment/Work Situation: Employment / Work Situation Employment Situation: Employed Work Stressors: yes Patient's Job has Been Impacted by Current Illness: No Describe how Patient's Job has Been Impacted: n/a Has Patient ever Been in the [employer]?: No  Education: Education Is Patient Currently Attending School?: No Last Grade Completed: 12 Did You Product manager?: No Did You Have An Individualized Education Program (IIEP): No Did You Have Any Difficulty At [employer]?: No Patient's Education Has Been Impacted by Current Illness: No   CCA Family/Childhood History Family and Relationship History: Family history Marital status: Single Does patient have children?: No  Childhood History:  Childhood History By whom was/is the patient raised?: Mother, Mother/father and  step-parent Did patient suffer any verbal/emotional/physical/sexual abuse as a child?: No Did patient suffer from severe childhood neglect?: No Has patient ever been sexually abused/assaulted/raped as an adolescent or adult?: No Was the patient ever a victim of a crime or a disaster?: No Witnessed domestic violence?: No Has patient been affected by domestic violence as an adult?: No       CCA Substance Use Alcohol/Drug Use: Alcohol / Drug Use Pain Medications: SEE MAR Prescriptions: SEE MAR Over the Counter: SEE MAR History of alcohol / drug use?: No history of alcohol / drug abuse Longest period of sobriety (when/how long): n/a Negative Consequences of Use:  (n/a) Withdrawal Symptoms:  (n/a)                         ASAM's:  Six Dimensions of Multidimensional Assessment  Dimension 1:  Acute Intoxication and/or Withdrawal Potential:   Dimension 1:  Description of individual's past  and current experiences of substance use and withdrawal: n/a  Dimension 2:  Biomedical Conditions and Complications:   Dimension 2:  Description of patient's biomedical conditions and  complications: n/a  Dimension 3:  Emotional, Behavioral, or Cognitive Conditions and Complications:     Dimension 4:  Readiness to Change:  Dimension 4:  Description of Readiness to Change criteria: n/a  Dimension 5:  Relapse, Continued use, or Continued Problem Potential:  Dimension 5:  Relapse, continued use, or continued problem potential critiera description: n/a  Dimension 6:  Recovery/Living Environment:  Dimension 6:  Recovery/Iiving environment criteria description: n/a  ASAM Severity Score:    ASAM Recommended Level of Treatment:     Substance use Disorder (SUD) Substance Use Disorder (SUD)  Checklist Symptoms of Substance Use:  (n/a)  Recommendations for Services/Supports/Treatments: Recommendations for Services/Supports/Treatments Recommendations For Services/Supports/Treatments: Individual  Therapy, IOP (Intensive Outpatient Program), Medication Management, Partial Hospitalization, Inpatient Hospitalization  Discharge Disposition: Discharge Disposition Medical Exam completed: Yes Disposition of Patient: Admit  DSM5 Diagnoses: There are no problems to display for this patient.    Referrals to Alternative Service(s): Referred to Alternative Service(s):   Place:   Date:   Time:    Referred to Alternative Service(s):   Place:   Date:   Time:    Referred to Alternative Service(s):   Place:   Date:   Time:    Referred to Alternative Service(s):   Place:   Date:   Time:     Burnetta Sabin, [facility]

## 2023-01-27 NOTE — ED Notes (Signed)
Security wanded pt ?

## 2023-01-27 NOTE — Progress Notes (Signed)
LCSW Progress Note  [id number]   Raymond Kallay  01/27/2023  9:54 AM  Description:   Inpatient Psychiatric Referral  Patient was recommended inpatient per Rockney Ghee, NP. There are no available beds at [facility], per [facility] Rona Ravens, RN. Patient was referred to the following out of network facilities:   [facility] Provider Address Phone Fax  [facility]  [address], [place] [address] [phone]  [facility]  [address] [place] [address] [phone]  [facility]  [address] [place] [address] [phone]  [facility]  [address] [place], [place] [address] [phone]  [facility] [address] [place]  [address] [place] [address] [phone]  [facility]  [address] [place], [place] [address] [phone]  [facility]  [address] [place] [address] [phone]  [facility]  [address] [place] [address] [phone]  [facility]  [address] [place] [address] [phone]  [facility]  [address] [place], [place] [address] [phone]  [facility]  [address] [place] [address] [phone]  [facility]  [address] [place] [address] [phone]  [facility]  [address] [place] [address] [phone]  [facility]  [address] [place] [address] [phone]  [facility]  [address] [place] [address] [phone]  [facility]  [address],  [place] [address] [phone]  [facility]  [address], [place] [address] [phone]  [facility]  [address] [place] [address] [phone]  [facility]  [address] [place] [address] [phone]  [facility]  [address], [place] [address] [phone]  [facility]  [address] [place], [place] [address] [phone]  [facility]  [address] [place] [address] [phone]  [facility]  [address] [place] [address] [phone]  [facility]  [address] [place] [address] [phone]  [facility]  [address], [place] [address] [phone]  [facility]  [address] [place] [address] [phone]  [facility]  [address], [place] [address] [phone]  [facility] Patient Placement  [facility], [place] [phone]  [facility]  [facility] [address] [place] [address] [phone]  [facility] [place]  [address] [place], [place] [address] [phone]  Renella Cunas  [place] -- [phone]  [facility]  [address] [place] [address] [phone]    Situation ongoing, CSW to continue following and update chart as more information becomes available.      Cathie Beams, LCSW  01/27/2023 9:54 AM

## 2023-01-27 NOTE — ED Notes (Signed)
Nurse called facility to inform them that pat has left.

## 2023-01-27 NOTE — ED Notes (Addendum)
Pt requested all contacts be removed from his chart because he doesn't want anyone having his information.  Stated " I don't want them called even if I am dead. They shouldn't know anything since they put me in here." Pt now stated he does not want TTS to call his mother as collateral. Nurse informed pt TTS needs someone and he said " well , I don't have anyone".

## 2023-01-27 NOTE — ED Notes (Signed)
PD here to escort pt . Belongings with cell phone on the bag.

## 2023-01-27 NOTE — ED Notes (Signed)
Pt has not eaten today. 

## 2023-01-27 NOTE — Progress Notes (Signed)
[facility], pending IVC paperwork faxed to [phone].  Pt meets inpatient criteria per Rockney Ghee, NP  Attending Physician will be Neila Gear, MD  Report can be called to: [phone]  Pt can arrive after pending items are received  Care Team Notified: Gretta Cool, MD, Caryn Bee, NP, and Iona Coach, RN  [place], LCSW  01/27/2023 10:34 AM

## 2023-01-27 NOTE — Progress Notes (Signed)
Pt is currently under review at [facility], pending IVC paperwork faxed to [phone], attn: Onalee Hua. CSW notified RN.  Cathie Beams, LCSW  01/27/2023 10:27 AM

## 2023-01-27 NOTE — ED Notes (Signed)
Pt dressed out into scrubs. Pt belongings placed into 1 bag into locker. Pt is frustrated due to he is supposed to go to work at [employer] and he is missing work.

## 2023-02-06 ENCOUNTER — Other Ambulatory Visit: Payer: Self-pay

## 2023-02-06 ENCOUNTER — Encounter (HOSPITAL_COMMUNITY): Payer: Self-pay | Admitting: Emergency Medicine

## 2023-02-06 ENCOUNTER — Emergency Department (HOSPITAL_COMMUNITY)
Admission: EM | Admit: 2023-02-06 | Discharge: 2023-02-06 | Disposition: A | Discharge status: Home / Self Care | Payer: Commercial Managed Care - PPO | Source: Home / Self Care | Attending: Emergency Medicine | Admitting: Emergency Medicine

## 2023-02-06 DIAGNOSIS — F411 Generalized anxiety disorder: Secondary | ICD-10-CM | POA: Diagnosis not present

## 2023-02-06 DIAGNOSIS — R251 Tremor, unspecified: Secondary | ICD-10-CM | POA: Insufficient documentation

## 2023-02-06 DIAGNOSIS — T50905A Adverse effect of unspecified drugs, medicaments and biological substances, initial encounter: Secondary | ICD-10-CM | POA: Diagnosis not present

## 2023-02-06 DIAGNOSIS — T43595A Adverse effect of other antipsychotics and neuroleptics, initial encounter: Secondary | ICD-10-CM | POA: Insufficient documentation

## 2023-02-06 DIAGNOSIS — R531 Weakness: Secondary | ICD-10-CM | POA: Diagnosis not present

## 2023-02-06 DIAGNOSIS — J45909 Unspecified asthma, uncomplicated: Secondary | ICD-10-CM | POA: Insufficient documentation

## 2023-02-06 NOTE — Discharge Instructions (Signed)
Are seen emergency department today with concern for medication reaction.  The medication that you recently started does have a known side effect of movement disorders such as restlessness, difficulty swallowing, and tremors.  I suspect this is likely what was causing your symptoms.  You can take some Benadryl to help with this.  Please follow-up with the provider who prescribed it to you.

## 2023-02-06 NOTE — ED Provider Notes (Signed)
[place] EMERGENCY DEPARTMENT AT [facility] Provider Note   CSN: [id number] Arrival date & time: 02/06/23  2042     History  Chief Complaint  Patient presents with   Medication Reaction    Michael Francis is a 22 y.o. male with history of asthma who presents the emergency department with concern for a medication reaction.  Patient was recently started on Abilify and states that he has been having tremors, and feeling an intermittent "tight throat".  He says this only happens every now and then, but when it does he feels very anxious and has a hard time breathing.  No difficulty swallowing.  No sore throat or cough.  He is not having any symptoms at this moment.  HPI     Home Medications Prior to Admission medications   Medication Sig Start Date End Date Taking? Authorizing Provider  albuterol (VENTOLIN HFA) 108 (90 Base) MCG/ACT inhaler Inhale 2 puffs into the lungs every 4 (four) hours as needed for wheezing or shortness of breath. 06/16/22   Sabas Sous, MD      Allergies    Patient has no known allergies.    Review of Systems   Review of Systems  HENT:         "Tight throat"  All other systems reviewed and are negative.   Physical Exam Updated Vital Signs BP 134/76 (BP Location: Right Arm)   Pulse (!) 108   Temp 98.6 F (37 C) (Oral)   Resp 15   Ht 5\' 10"  (1.778 m)   Wt 93 kg   SpO2 100%   BMI 29.42 kg/m  Physical Exam Vitals and nursing note reviewed.  Constitutional:      Appearance: Normal appearance.  HENT:     Head: Normocephalic and atraumatic.     Mouth/Throat:     Lips: Pink.     Mouth: Mucous membranes are moist.     Pharynx: Oropharynx is clear. Uvula midline.  Eyes:     Conjunctiva/sclera: Conjunctivae normal.  Neck:     Thyroid: No thyroid mass or thyromegaly.     Trachea: Trachea and phonation normal.  Cardiovascular:     Rate and Rhythm: Normal rate and regular rhythm.  Pulmonary:     Effort: Pulmonary  effort is normal. No respiratory distress.     Breath sounds: Normal breath sounds.  Musculoskeletal:     Cervical back: Full passive range of motion without pain.  Lymphadenopathy:     Cervical: No cervical adenopathy.  Skin:    General: Skin is warm and dry.  Neurological:     Mental Status: He is alert.  Psychiatric:        Mood and Affect: Mood normal.        Behavior: Behavior normal.     ED Results / Procedures / Treatments   Labs (all labs ordered are listed, but only abnormal results are displayed) Labs Reviewed - No data to display  EKG None  Radiology No results found.  Procedures Procedures    Medications Ordered in ED Medications - No data to display  ED Course/ Medical Decision Making/ A&P                                 Medical Decision Making  Patient is a 22 year old male with history of asthma who presents the emergency department with concern for medication reaction. Patient is describing a "tight  throat" sensation after he was started on Abilify.   On exam patient is clinically well-appearing, normal vital signs, no acute distress.Marland Kitchen  HEENT exam normal.  Lung sounds clear.  Discussed with him I suspect this is likely an extraparametal symptom which is an known adverse effect of Abilify.  He has already stopped the medication and is made the prescribing provider aware.  Recommended he take Benadryl if needed, and follow-up with his PCP.  Final Clinical Impression(s) / ED Diagnoses Final diagnoses:  Adverse effect of drug, initial encounter    Rx / DC Orders ED Discharge Orders     None      Portions of this report may have been transcribed using voice recognition software. Every effort was made to ensure accuracy; however, inadvertent computerized transcription errors may be present.    Jeanella Flattery 02/06/23 2138    Bethann Berkshire, MD 02/07/23 1212

## 2023-02-06 NOTE — ED Triage Notes (Signed)
Pt with c/o "tight throat". States he is "unsure if he has a clogged vein or what". Denies difficulty swallowing. Pt recently started on new medication (Aripiprazole) and states it has been giving him tremors.

## 2023-02-11 ENCOUNTER — Ambulatory Visit: Payer: Commercial Managed Care - PPO | Admitting: Psychiatry

## 2023-02-11 ENCOUNTER — Encounter: Payer: Self-pay | Admitting: Psychiatry

## 2023-02-11 NOTE — Progress Notes (Deleted)
Referring:  [name], NP [address] [place],  [place] [address]  PCP: John Giovanni, MD  Neurology was asked to evaluate Michael Francis, a 22 year old male for a chief complaint of headaches.  Our recommendations of care will be communicated by shared medical record.    CC:  headaches  History provided from ***  HPI:  Medical co-morbidities: asthma, schizoaffective disorder   The patient presents for evaluation of headaches which began one year ago. Headaches are associated with nausea***Takes Tylenol or goody powder which help***  Headache History: Onset: Triggers: Aura: Location: Quality/Description: Associated Symptoms:  Photophobia:  Phonophobia:  Nausea: Vomiting: Allodynia: Other symptoms: Worse with activity?: Duration of headaches:  Headache days per month: *** Migraine days per month: *** Headache free days per month: ***  Current Treatment: Abortive ***  Preventative ***  Prior Therapies                                 ***   LABS: CBC    Component Value Date/Time   WBC 8.9 01/26/2023 2318   RBC 5.69 01/26/2023 2318   HGB 14.9 01/26/2023 2318   HCT 45.5 01/26/2023 2318   PLT 319 01/26/2023 2318   MCV 80.0 01/26/2023 2318   MCH 26.2 01/26/2023 2318   MCHC 32.7 01/26/2023 2318   RDW 15.0 01/26/2023 2318   LYMPHSABS 2.4 01/26/2023 2318   MONOABS 0.8 01/26/2023 2318   EOSABS 0.1 01/26/2023 2318   BASOSABS 0.0 01/26/2023 2318      Latest Ref Rng & Units 01/26/2023   11:18 PM 09/11/2020   11:38 AM  CMP  Glucose 70 - 99 mg/dL 89  99   BUN 6 - 20 mg/dL 11  9   Creatinine 1.91 - 1.24 mg/dL 4.78  2.95   Sodium 621 - 145 mmol/L 140  140   Potassium 3.5 - 5.1 mmol/L 3.9  4.5   Chloride 98 - 111 mmol/L 104  103   CO2 22 - 32 mmol/L 25  28   Calcium 8.9 - 10.3 mg/dL 9.3  30.8   Total Protein 6.5 - 8.1 g/dL 7.8  7.9   Total Bilirubin 0.3 - 1.2 mg/dL 0.3  0.4   Alkaline Phos 38 - 126 U/L 87    AST 15 - 41 U/L 27  14    ALT 0 - 44 U/L 51  18      IMAGING:  [facility] 02/24/22: unremarkable  Imaging independently reviewed on February 11, 2023   Current Outpatient Medications on File Prior to Visit  Medication Sig Dispense Refill   albuterol (VENTOLIN HFA) 108 (90 Base) MCG/ACT inhaler Inhale 2 puffs into the lungs every 4 (four) hours as needed for wheezing or shortness of breath. 18 g 1   No current facility-administered medications on file prior to visit.     Allergies: No Known Allergies  Family History: Migraine or other headaches in the family:  *** Aneurysms in a first degree relative:  *** Brain tumors in the family:  *** Other neurological illness in the family:   ***  Past Medical History: Past Medical History:  Diagnosis Date   Asthma     Past Surgical History No past surgical history on file.  Social History: Social History   Tobacco Use   Smoking status: Never   Smokeless tobacco: Never  Vaping Use   Vaping status: Never Used  Substance Use Topics  Alcohol use: No   Drug use: No   ***  ROS: Negative for fevers, chills. Positive for***. All other systems reviewed and negative unless stated otherwise in HPI.   Physical Exam:   Vital Signs: There were no vitals taken for this visit. GENERAL: well appearing,in no acute distress,alert SKIN:  Color, texture, turgor normal. No rashes or lesions HEAD:  Normocephalic/atraumatic. CV:  RRR RESP: Normal respiratory effort MSK: no tenderness to palpation over occiput, neck, or shoulders  NEUROLOGICAL: Mental Status: Alert, oriented to person, place and time,Follows commands Cranial Nerves: PERRL, visual fields intact to confrontation, extraocular movements intact, facial sensation intact, no facial droop or ptosis, hearing grossly intact, no dysarthria, palate elevate symmetrically, tongue protrudes midline, shoulder shrug intact and symmetric Motor: muscle strength 5/5 both upper and lower extremities,no drift, normal  tone Reflexes: 2+ throughout Sensation: intact to light touch all 4 extremities Coordination: Finger-to- nose-finger intact bilaterally Gait: normal-based   IMPRESSION: ***  PLAN: ***   I spent a total of *** minutes chart reviewing and counseling the patient. Headache education was done. Discussed treatment options including preventive and acute medications, natural supplements, and physical therapy. Discussed medication overuse headache and to limit use of acute treatments to no more than 2 days/week or 10 days/month. Discussed medication side effects, adverse reactions and drug interactions. Written educational materials and patient instructions outlining all of the above were given.  Follow-up: ***   Ocie Doyne, MD 02/11/2023   10:14 AM

## 2023-02-17 DIAGNOSIS — R519 Headache, unspecified: Secondary | ICD-10-CM | POA: Diagnosis not present

## 2023-02-17 DIAGNOSIS — M2550 Pain in unspecified joint: Secondary | ICD-10-CM | POA: Diagnosis not present

## 2023-02-17 DIAGNOSIS — Z1329 Encounter for screening for other suspected endocrine disorder: Secondary | ICD-10-CM | POA: Diagnosis not present

## 2023-02-25 DIAGNOSIS — K219 Gastro-esophageal reflux disease without esophagitis: Secondary | ICD-10-CM | POA: Diagnosis not present

## 2023-02-25 DIAGNOSIS — G44209 Tension-type headache, unspecified, not intractable: Secondary | ICD-10-CM | POA: Diagnosis not present

## 2023-02-25 DIAGNOSIS — M2550 Pain in unspecified joint: Secondary | ICD-10-CM | POA: Diagnosis not present

## 2023-02-25 DIAGNOSIS — E559 Vitamin D deficiency, unspecified: Secondary | ICD-10-CM | POA: Diagnosis not present

## 2023-03-02 DIAGNOSIS — Z23 Encounter for immunization: Secondary | ICD-10-CM | POA: Diagnosis not present

## 2023-03-05 ENCOUNTER — Encounter: Payer: Self-pay | Admitting: *Deleted

## 2023-03-09 ENCOUNTER — Other Ambulatory Visit (HOSPITAL_COMMUNITY): Payer: Self-pay

## 2023-03-09 DIAGNOSIS — F411 Generalized anxiety disorder: Secondary | ICD-10-CM | POA: Diagnosis not present

## 2023-03-09 MED ORDER — LAMOTRIGINE 25 MG PO TABS
25.0000 mg | ORAL_TABLET | Freq: Every day | ORAL | 0 refills | Status: AC
  Filled 2023-03-09: qty 15, 15d supply, fill #0

## 2023-03-09 MED ORDER — HYDROXYZINE HCL 25 MG PO TABS
25.0000 mg | ORAL_TABLET | Freq: Two times a day (BID) | ORAL | 0 refills | Status: AC | PRN
  Filled 2023-03-09: qty 30, 15d supply, fill #0

## 2023-03-12 DIAGNOSIS — F419 Anxiety disorder, unspecified: Secondary | ICD-10-CM | POA: Diagnosis not present

## 2023-03-12 DIAGNOSIS — J45909 Unspecified asthma, uncomplicated: Secondary | ICD-10-CM | POA: Diagnosis not present

## 2023-03-12 DIAGNOSIS — E559 Vitamin D deficiency, unspecified: Secondary | ICD-10-CM | POA: Diagnosis not present

## 2023-03-12 DIAGNOSIS — Z1152 Encounter for screening for COVID-19: Secondary | ICD-10-CM | POA: Diagnosis not present

## 2023-03-12 DIAGNOSIS — F32A Depression, unspecified: Secondary | ICD-10-CM | POA: Diagnosis not present

## 2023-03-12 DIAGNOSIS — Z5901 Sheltered homelessness: Secondary | ICD-10-CM | POA: Diagnosis not present

## 2023-03-12 DIAGNOSIS — R45851 Suicidal ideations: Secondary | ICD-10-CM | POA: Diagnosis not present

## 2023-03-12 DIAGNOSIS — R569 Unspecified convulsions: Secondary | ICD-10-CM | POA: Diagnosis not present

## 2023-03-12 DIAGNOSIS — F603 Borderline personality disorder: Secondary | ICD-10-CM | POA: Diagnosis not present

## 2023-03-12 DIAGNOSIS — K219 Gastro-esophageal reflux disease without esophagitis: Secondary | ICD-10-CM | POA: Diagnosis not present

## 2023-03-13 DIAGNOSIS — F603 Borderline personality disorder: Secondary | ICD-10-CM | POA: Diagnosis not present

## 2023-03-14 DIAGNOSIS — F603 Borderline personality disorder: Secondary | ICD-10-CM | POA: Diagnosis not present

## 2023-03-14 DIAGNOSIS — J45909 Unspecified asthma, uncomplicated: Secondary | ICD-10-CM | POA: Diagnosis not present

## 2023-03-14 DIAGNOSIS — E559 Vitamin D deficiency, unspecified: Secondary | ICD-10-CM | POA: Diagnosis not present

## 2023-03-14 DIAGNOSIS — K219 Gastro-esophageal reflux disease without esophagitis: Secondary | ICD-10-CM | POA: Diagnosis not present

## 2023-03-20 ENCOUNTER — Other Ambulatory Visit (HOSPITAL_COMMUNITY): Payer: Self-pay

## 2023-03-30 DIAGNOSIS — F411 Generalized anxiety disorder: Secondary | ICD-10-CM | POA: Diagnosis not present

## 2023-06-12 DIAGNOSIS — Z23 Encounter for immunization: Secondary | ICD-10-CM | POA: Diagnosis not present

## 2024-03-11 ENCOUNTER — Encounter: Payer: Self-pay | Admitting: *Deleted

## 2024-03-20 DIAGNOSIS — H6692 Otitis media, unspecified, left ear: Secondary | ICD-10-CM | POA: Diagnosis not present

## 2024-03-20 DIAGNOSIS — J069 Acute upper respiratory infection, unspecified: Secondary | ICD-10-CM | POA: Diagnosis not present

## 2024-03-20 DIAGNOSIS — Z20822 Contact with and (suspected) exposure to covid-19: Secondary | ICD-10-CM | POA: Diagnosis not present

## 2024-03-20 DIAGNOSIS — K219 Gastro-esophageal reflux disease without esophagitis: Secondary | ICD-10-CM | POA: Diagnosis not present

## 2024-03-28 DIAGNOSIS — S8391XA Sprain of unspecified site of right knee, initial encounter: Secondary | ICD-10-CM | POA: Diagnosis not present

## 2024-03-28 DIAGNOSIS — X58XXXA Exposure to other specified factors, initial encounter: Secondary | ICD-10-CM | POA: Diagnosis not present

## 2024-03-28 DIAGNOSIS — M25561 Pain in right knee: Secondary | ICD-10-CM | POA: Diagnosis not present

## 2024-03-28 DIAGNOSIS — S8981XA Other specified injuries of right lower leg, initial encounter: Secondary | ICD-10-CM | POA: Diagnosis not present

## 2024-03-28 DIAGNOSIS — Z5941 Food insecurity: Secondary | ICD-10-CM | POA: Diagnosis not present
